# Patient Record
Sex: Male | Born: 2000 | Race: White | Hispanic: No | Marital: Single | Smoking: Never smoker
Health system: Southern US, Community
[De-identification: ages and names within clinical notes are randomized; demographics above are authoritative.]

## PROBLEM LIST (undated history)

## (undated) DIAGNOSIS — J45909 Unspecified asthma, uncomplicated: Secondary | ICD-10-CM

## (undated) HISTORY — PX: TYMPANOSTOMY TUBE PLACEMENT: SHX32

## (undated) HISTORY — PX: FRACTURE SURGERY: SHX138

---
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
## 2024-05-10 ENCOUNTER — Encounter (HOSPITAL_COMMUNITY): Payer: Self-pay | Admitting: Emergency Medicine

## 2024-05-10 ENCOUNTER — Other Ambulatory Visit: Payer: Self-pay

## 2024-05-10 ENCOUNTER — Emergency Department (HOSPITAL_COMMUNITY)

## 2024-05-10 ENCOUNTER — Encounter (HOSPITAL_COMMUNITY): Payer: Self-pay

## 2024-05-10 ENCOUNTER — Ambulatory Visit (HOSPITAL_COMMUNITY): Admission: EM | Admit: 2024-05-10 | Discharge: 2024-05-10 | Disposition: A | Discharge status: Home / Self Care

## 2024-05-10 ENCOUNTER — Emergency Department (HOSPITAL_COMMUNITY): Admission: EM | Admit: 2024-05-10 | Discharge: 2024-05-10 | Disposition: A | Discharge status: Home / Self Care

## 2024-05-10 DIAGNOSIS — R079 Chest pain, unspecified: Secondary | ICD-10-CM

## 2024-05-10 DIAGNOSIS — R0602 Shortness of breath: Secondary | ICD-10-CM

## 2024-05-10 DIAGNOSIS — R42 Dizziness and giddiness: Secondary | ICD-10-CM

## 2024-05-10 LAB — BASIC METABOLIC PANEL WITH GFR
Anion gap: 13 (ref 5–15)
BUN: 24 mg/dL — ABNORMAL HIGH (ref 6–20)
CO2: 23 mmol/L (ref 22–32)
Calcium: 9.5 mg/dL (ref 8.9–10.3)
Chloride: 102 mmol/L (ref 98–111)
Creatinine, Ser: 1.19 mg/dL (ref 0.61–1.24)
GFR, Estimated: >60 mL/min (ref >60)
Glucose, Bld: 85 mg/dL (ref 70–99)
Potassium: 4.2 mmol/L (ref 3.5–5.1)
Sodium: 138 mmol/L (ref 135–145)

## 2024-05-10 LAB — CBC
HCT: 44.2 % (ref 39.0–52.0)
Hemoglobin: 14.5 g/dL (ref 13.0–17.0)
MCH: 28.8 pg (ref 26.0–34.0)
MCHC: 32.8 g/dL (ref 30.0–36.0)
MCV: 87.7 fL (ref 80.0–100.0)
Platelets: 276 K/uL (ref 150–400)
RBC: 5.04 MIL/uL (ref 4.22–5.81)
RDW: 14.5 % (ref 11.5–15.5)
WBC: 8.1 K/uL (ref 4.0–10.5)
nRBC: 0 % (ref 0.0–0.2)

## 2024-05-10 LAB — TROPONIN I (HIGH SENSITIVITY): Troponin I (High Sensitivity): 10 ng/L (ref <18)

## 2024-05-10 NOTE — ED Provider Notes (Signed)
 Accepted handoff at shift change from [facility]. Please see prior provider note for more detail.   Briefly: Patient is 23 y.o. presenting for chest pain.  DDX: concern for DDx includes PE, ACS, pneumonia, pneumothorax, anemia, other.  Plan: Reassess and follow-up on BMP   Physical Exam  BP (!) 143/75   Pulse 85   Temp 98.1 F (36.7 C)   Resp 18   Ht 5' 6 (1.676 m)   Wt 73.5 kg   SpO2 99%   BMI 26.15 kg/m   Physical Exam  Procedures  Procedures  ED Course / MDM   Clinical Course as of 05/10/24 1553  Fri May 10, 2024  1503 Here for chest pain. IF bmp is ok, can dc with PCP f/u. [JR]    Clinical Course User Index [JR] Lang Norleen POUR, PA-C   Medical Decision Making Amount and/or Complexity of Data Reviewed Labs: ordered. Radiology: ordered.   On reassessment, chest pain improved.  Workup unremarkable.  Advised him to follow-up with his PCP.  Discussed return precautions.  Discharged good condition.        Lang Norleen POUR, PA-C 05/10/24 1608    Ruthe Cornet, DO 05/10/24 1826

## 2024-05-10 NOTE — Discharge Instructions (Addendum)
 Evaluation today was reassuring.  If your symptoms worsen anyway please return to the ED for further evaluation.  Otherwise recommend follow-up with your PCP.

## 2024-05-10 NOTE — ED Provider Notes (Signed)
 [place] EMERGENCY DEPARTMENT AT [facility] Provider Note   CSN: [id number] Arrival date & time: 05/10/24  1200     Patient presents with: Shortness of Breath and Chest Pain   Donald Good is a 23 y.o. male.   Patient complains of chest pain that started this a.m.  Patient reports he has pain in his mid chest.  Patient reports symptoms began this a.m.  Patient reports he has not had any cough or congestion.  Patient denies any fever or chills he has not had any injury.  Patient denies any exposure to anyone with illness.  Patient denies any history of heart disease he does not have any high blood pressure no diabetes.  Patient reports he does not smoke.  Patient reports he does do strenuous activity.  He does do heavy lifting.  Patient was evaluated at urgent care and sent here for evaluation.  The history is provided by the patient. No language interpreter was used.  Shortness of Breath Associated symptoms: chest pain   Chest Pain Associated symptoms: shortness of breath        Prior to Admission medications   Not on File    Allergies: Augmentin [amoxicillin-pot clavulanate]    Review of Systems  Respiratory:  Positive for shortness of breath.   Cardiovascular:  Positive for chest pain.  All other systems reviewed and are negative.   Updated Vital Signs BP (!) 143/75   Pulse 85   Temp 98.1 F (36.7 C)   Resp 18   Ht 5' 6 (1.676 m)   Wt 73.5 kg   SpO2 99%   BMI 26.15 kg/m   Physical Exam Vitals and nursing note reviewed.  Constitutional:      Appearance: He is well-developed.  HENT:     Head: Normocephalic.  Cardiovascular:     Rate and Rhythm: Normal rate.  Pulmonary:     Effort: Pulmonary effort is normal.     Breath sounds: No decreased breath sounds.  Abdominal:     General: There is no distension.  Musculoskeletal:        General: Normal range of motion.     Cervical back: Normal range of motion.  Skin:    General: Skin is warm.   Neurological:     General: No focal deficit present.     Mental Status: He is alert and oriented to person, place, and time.  Psychiatric:        Mood and Affect: Mood normal.     (all labs ordered are listed, but only abnormal results are displayed) Labs Reviewed  BASIC METABOLIC PANEL WITH GFR  CBC  TROPONIN I (HIGH SENSITIVITY)  TROPONIN I (HIGH SENSITIVITY)    EKG: EKG Interpretation Date/Time:  Friday May 10 2024 12:10:51 EDT Ventricular Rate:  70 PR Interval:  130 QRS Duration:  96 QT Interval:  372 QTC Calculation: 401 R Axis:   87  Text Interpretation: Normal sinus rhythm Incomplete right bundle branch block Borderline ECG No previous ECGs available Confirmed by Donald Good [phone]) on 05/10/2024 1:23:28 PM  Radiology: DG Chest 2 View Result Date: 05/10/2024 CLINICAL DATA:  Chest pain EXAM: CHEST - 2 VIEW COMPARISON:  Chest radiograph 09/11/2005 FINDINGS: The heart size and mediastinal contours are within normal limits. Both lungs are clear. The visualized skeletal structures are unremarkable. IMPRESSION: No active cardiopulmonary disease. Electronically Signed   By: Donald Good M.D.   On: 05/10/2024 13:49     Procedures   Medications  Ordered in the ED - No data to display  Clinical Course as of 05/10/24 1505  Fri May 10, 2024  1503 Here for chest pain. IF bmp is ok, can dc with PCP f/u. [JR]    Clinical Course User Index [JR] Donald Norleen POUR, PA-C                                 Medical Decision Making Patient complains of pain in the middle of his chest since this a.m. he has not had any shortness of breath.  Patient has not had any recent travel he is not on any hormone therapy.  Amount and/or Complexity of Data Reviewed External Data Reviewed: notes.    Details: Urged care note reviewed Labs: ordered.    Details: Labs ordered reviewed and interpreted Radiology: ordered and independent interpretation performed. Decision-making details documented  in ED Course.    Details: Chest x-ray no acute cardiopulmonary disease ECG/medicine tests: ordered and independent interpretation performed. Decision-making details documented in ED Course.    Details: EKG normal sinus rhythm normal EKG.        Final diagnoses:  Nonspecific chest pain    ED Discharge Orders     None          Donald Good Donald Good 05/10/24 1515    Donald Prentice SAUNDERS, MD 05/10/24 [phone]

## 2024-05-10 NOTE — ED Triage Notes (Signed)
 Pt reports yesterday around 6p started having central chest pain, sore throat with swall;owing. Chest pain worse with deep breathing and picking up heavy boxes. Hasn't taken anything for symptoms.

## 2024-05-10 NOTE — ED Triage Notes (Signed)
 Pt to ED from [facility] c/o [facility] worse with exertion that started yesterday around 6pm, reports sharp chest pain with deep breathing.

## 2024-05-10 NOTE — ED Provider Notes (Signed)
 Patient to urgent care today for evaluation of chest pain that started last night around 6 PM.  He states that he is having some shortness of breath and has chest pain more when he is picking up a heavy box.  He also reports he had some sore throat with swallowing.  He has had some nausea but no vomiting.  He does note some lightheadedness at times.  Recommended further evaluation in the emergency room for stat labs and imaging.  Patient is agreeable with same and will transport via POV.   Billy Asberry FALCON, PA-C 05/10/24 1156

## 2024-05-10 NOTE — ED Notes (Signed)
 Pt was d/c at 1623 by Olam, RN & was on the board after departure but v/s were done & pain rated 1 before leaving.

## 2024-06-25 ENCOUNTER — Ambulatory Visit

## 2024-06-25 VITALS — BP 126/82 | HR 70 | Temp 98.1°F | Resp 16 | Ht 66.0 in | Wt 160.5 lb

## 2024-06-25 DIAGNOSIS — M940 Chondrocostal junction syndrome [Tietze]: Secondary | ICD-10-CM | POA: Insufficient documentation

## 2024-06-25 DIAGNOSIS — J028 Acute pharyngitis due to other specified organisms: Secondary | ICD-10-CM | POA: Diagnosis not present

## 2024-06-25 DIAGNOSIS — B9789 Other viral agents as the cause of diseases classified elsewhere: Secondary | ICD-10-CM | POA: Diagnosis not present

## 2024-06-25 DIAGNOSIS — M545 Low back pain, unspecified: Secondary | ICD-10-CM | POA: Diagnosis not present

## 2024-06-25 DIAGNOSIS — R059 Cough, unspecified: Secondary | ICD-10-CM | POA: Insufficient documentation

## 2024-06-25 DIAGNOSIS — G8929 Other chronic pain: Secondary | ICD-10-CM | POA: Insufficient documentation

## 2024-06-25 LAB — POC COVID19/FLU A&B COMBO
Covid Antigen, POC: NEGATIVE
Influenza A Antigen, POC: NEGATIVE
Influenza B Antigen, POC: NEGATIVE

## 2024-06-25 LAB — POCT RAPID STREP A (OFFICE): Rapid Strep A Screen: NEGATIVE

## 2024-06-25 NOTE — Assessment & Plan Note (Addendum)
 Patient with 4 days of cough, congestion, N/V/D, and sore throat. Recent sick contact with COVID. POC testing today negative for COVID, flu, and strep. Most likely viral illness which is already improving. - Recommend rest and supportive care including plenty of fluids, NSAIDs PRN, Mucinex PRN

## 2024-06-25 NOTE — Assessment & Plan Note (Signed)
 Patient with episode of chest pain 1 month ago that has now resolved. Went to ED at that time and cardiac workup was normal. Low suspicion of cardiac etiology. Given patient was lifting heavy boxes and working out, I suspect he had costochondritis vs. muscle strain. No further treatment needed at this time given pain has resolved.

## 2024-06-25 NOTE — Patient Instructions (Addendum)
 I recommend trying Mucinex (guaifenesin) for cough and congestion.   I recommend warm tea w/honey and  ibuprofen for sore throat.
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
## 2024-08-01 NOTE — Progress Notes (Deleted)
      Established patient visit   Patient: Donald Good   DOB: 02-15-2001   23 y.o. Male  MRN: [id number] Visit Date: 08/02/2024  Today's healthcare provider: Isaiah DELENA Pepper, MD   No chief complaint on file.  Subjective    HPI            Medications: No outpatient medications prior to visit.   No facility-administered medications prior to visit.    Review of Systems as noted in HPI.  {Insert previous labs (optional):23779} {See past labs  Heme  Chem  Endocrine  Serology  Results Review (optional):1}   Objective    There were no vitals taken for this visit. {Insert last BP/Wt (optional):23777}{See vitals history (optional):1}  Physical Exam   No results found for any visits on 08/02/24.  Assessment & Plan     Problem List Items Addressed This Visit   None   Assessment and Plan               No follow-ups on file.       Isaiah DELENA Pepper, MD  [facility] [phone] (phone) [phone] (fax)

## 2024-08-02 ENCOUNTER — Ambulatory Visit

## 2024-08-12 ENCOUNTER — Ambulatory Visit

## 2024-08-12 VITALS — BP 131/73 | HR 72 | Ht 66.5 in | Wt 169.2 lb

## 2024-08-12 DIAGNOSIS — G4709 Other insomnia: Secondary | ICD-10-CM

## 2024-08-12 DIAGNOSIS — Z23 Encounter for immunization: Secondary | ICD-10-CM

## 2024-08-12 MED ORDER — PRAZOSIN HCL 1 MG PO CAPS: 1.0000 mg | ORAL_CAPSULE | Freq: Every day | ORAL | 3 refills | Status: AC

## 2024-08-12 NOTE — Progress Notes (Signed)
 Established patient visit   Patient: Donald Good   DOB: 30-Mar-2001   23 y.o. 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 He reports that sometimes he may fall asleep and his brother doesn't think he is breathing so he will check on him.    Insomnia    Melatonin helps with getting to sleep but still struggling staying asleep. Reports waking up 2-3 times a night   Subjective    HPI  Discussed the use of AI scribe software for clinical note transcription with the patient, who gave verbal consent to proceed.  History of Present Illness Donald Good is a 23 year old male who presents with sleep disturbances and nightmares.  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 This irregular schedule made it difficult for him to maintain a consistent sleep pattern. Despite moving to a more stable day schedule in [place] , he continues to have trouble staying asleep, waking up multiple times throughout the night.  He does not snore, but his brother, with whom he shares an apartment, has occasionally expressed concern about his breathing during sleep. He has not been told that he gasps for air.  He has tried using melatonin, which helps him fall asleep, especially after a poor night's sleep, but it does not prevent him from waking up during the night. He identifies nightmares as a significant factor in his sleep disturbances, which began after a traumatic event involving the loss of a close friend in [place]. He experiences both 'bad dreams' and 'nice dreams' that disrupt his sleep.  His current sleep schedule involves waking up at 4 AM  and going to bed around 8 or 9 PM. He works as a Art therapist, a physically demanding job that leaves him exhausted by the end of the day, allowing him to fall asleep easily but not stay asleep.  His mental health is generally good, though he admits to being more concerned with others' well-being than his own. He does not have thoughts of self-harm but acknowledges that lack of sleep may contribute to irritability over minor issues.  Medications: No outpatient medications prior to visit.   No facility-administered medications prior to visit.    Review of Systems as noted in HPI.      Objective    BP 131/73 (BP Location: Left Arm, Patient Position: Sitting, Cuff Size: Normal)   Pulse 72   Ht 5' 6.5 (1.689 m)   Wt 169 lb 3.2 oz (76.7 kg)   SpO2 98%   BMI 26.90 kg/m    Physical Exam Constitutional:      Appearance: Normal appearance.  HENT:     Head: Normocephalic and atraumatic.     Mouth/Throat:     Mouth: Mucous membranes are moist.  Eyes:     Pupils: Pupils are equal, round, and reactive to light.  Pulmonary:     Effort: Pulmonary effort is normal.  Skin:    General: Skin is warm.  Neurological:     General: No focal deficit present.     Mental Status: He is alert.      No  results found for any visits on 08/12/24.  Assessment & Plan     Problem List Items Addressed This Visit   None Visit Diagnoses       Other insomnia    -  Primary   Relevant Medications   prazosin (MINIPRESS) 1 MG capsule     Immunization due       Relevant Orders   Flu vaccine trivalent PF, 6mos and older(Flulaval,Afluria,Fluarix,Fluzone) (Completed)      Assessment & Plan Insomnia with nightmares Chronic, uncontrolled. Patient with >1 year of insomnia with difficulty staying asleep, likely exacerbated by nightmares related to his time in the [employer]. Lower risk for sleep apnea given no evidence of snoring and STOP-BANG score of 2. - Prescribe prazosin 1 mg at bedtime,  increase to 2 mg after a few days if tolerated. - Discussed side effects - Reassess in one month to evaluate prazosin's effectiveness on sleep and nightmares. - Consider sleep study if sleep disturbances persist   Return in about 4 weeks (around 09/09/2024) for Follow Up.       Isaiah DELENA Pepper, MD  [facility] [phone] (phone) [phone] (fax)

## 2024-09-09 ENCOUNTER — Ambulatory Visit

## 2024-09-09 NOTE — Progress Notes (Deleted)
      Established patient visit   Patient: Donald Good   DOB: 11-Jun-2001   23 y.o. Male  MRN: [id number] Visit Date: 09/09/2024  Today's healthcare provider: Isaiah DELENA Pepper, MD   No chief complaint on file.  Subjective    HPI  Discussed the use of AI scribe software for clinical note transcription with the patient, who gave verbal consent to proceed.  History of Present Illness      Medications: Outpatient Medications Prior to Visit  Medication Sig   prazosin (MINIPRESS) 1 MG capsule Take 1 capsule (1 mg total) by mouth at bedtime. If tolerating the medication, can increase to 2 capsules (2mg ) nightly.   No facility-administered medications prior to visit.    Review of Systems as noted in HPI.  {Insert previous labs (optional):23779} {See past labs  Heme  Chem  Endocrine  Serology  Results Review (optional):1}   Objective    There were no vitals taken for this visit. {Insert last BP/Wt (optional):23777}{See vitals history (optional):1}  Physical Exam   No results found for any visits on 09/09/24.  Assessment & Plan     Problem List Items Addressed This Visit   None   Assessment and Plan Assessment & Plan       No follow-ups on file.       Isaiah DELENA Pepper, MD  [facility] [phone] (phone) [phone] (fax)

## 2024-11-10 ENCOUNTER — Ambulatory Visit
Admission: EM | Admit: 2024-11-10 | Discharge: 2024-11-10 | Disposition: A | Discharge status: Home / Self Care | Attending: Nurse Practitioner | Admitting: Nurse Practitioner

## 2024-11-10 ENCOUNTER — Encounter: Payer: Self-pay | Admitting: Emergency Medicine

## 2024-11-10 DIAGNOSIS — J069 Acute upper respiratory infection, unspecified: Secondary | ICD-10-CM

## 2024-11-10 LAB — POC COVID19/FLU A&B COMBO
Covid Antigen, POC: NEGATIVE
Influenza A Antigen, POC: NEGATIVE
Influenza B Antigen, POC: NEGATIVE

## 2024-11-10 MED ORDER — BENZONATATE 100 MG PO CAPS: 100.0000 mg | ORAL_CAPSULE | Freq: Three times a day (TID) | ORAL | 0 refills | Status: AC | PRN

## 2024-11-10 NOTE — ED Provider Notes (Signed)
 " [facility]    CSN: [id number] Arrival date & time: 11/10/24  0906      History   Chief Complaint Chief Complaint  Patient presents with   Cough    HPI Donald Good is a 24 y.o. male.   Patient presents today with 1 day history of chills, body pain, dry cough, slight shortness of breath and chest pain with coughing, runny and stuffy nose, postnasal drainage, sore throat, headache, coughing up mucus, and fatigue.  He denies known fevers, chest pain or shortness of breath at rest, ear pain, abdominal pain, nausea, vomiting, and diarrhea.  Reports multiple sick contacts at work recently.  Has taken Sudafed which helped with the coughing.    Past Medical History:  Diagnosis Date   Asthma    as infant.  No issues since leaving [facility] many yrs ago    Patient Active Problem List   Diagnosis Date Noted   Cough 06/25/2024   Chronic back pain 06/25/2024   Costochondritis 06/25/2024    Past Surgical History:  Procedure Laterality Date   CLOSED REDUCTION NASAL FRACTURE N/A 05/14/2019   Procedure: CLOSED REDUCTION NASAL FRACTURE;  Surgeon: Blair Mt, MD;  Location: [facility];  Service: ENT;  Laterality: N/A;   FRACTURE SURGERY     arm.  age 56   TYMPANOSTOMY TUBE PLACEMENT         Home Medications    Prior to Admission medications  Medication Sig Start Date End Date Taking? Authorizing Provider  benzonatate  (TESSALON ) 100 MG capsule Take 1 capsule (100 mg total) by mouth 3 (three) times daily as needed for cough. Do not take with alcohol or while operating or driving heavy machinery 8/88/73  Yes Chandra Raisin A, NP  prazosin  (MINIPRESS ) 1 MG capsule Take 1 capsule (1 mg total) by mouth at bedtime. If tolerating the medication, can increase to 2 capsules (2mg ) nightly. 08/12/24   Franchot Isaiah LABOR, MD    Family History History reviewed. No pertinent family history.  Social History Social History[1]   Allergies   Augmentin [amoxicillin-pot  clavulanate]   Review of Systems Review of Systems Per HPI  Physical Exam Triage Vital Signs ED Triage Vitals  Encounter Vitals Group     BP 11/10/24 0936 139/89     Girls Systolic BP Percentile --      Girls Diastolic BP Percentile --      Boys Systolic BP Percentile --      Boys Diastolic BP Percentile --      Pulse Rate 11/10/24 0936 90     Resp 11/10/24 0936 15     Temp 11/10/24 0936 98.3 F (36.8 C)     Temp Source 11/10/24 0936 Oral     SpO2 11/10/24 0936 99 %     Weight 11/10/24 0934 185 lb (83.9 kg)     Height 11/10/24 0934 5' 6 (1.676 m)     Head Circumference --      Peak Flow --      Pain Score 11/10/24 0933 2     Pain Loc --      Pain Education --      Exclude from Growth Chart --    No data found.  Updated Vital Signs BP 139/89 (BP Location: Left Arm)   Pulse 90   Temp 98.3 F (36.8 C) (Oral)   Resp 15   Ht 5' 6 (1.676 m)   Wt 185 lb (83.9 kg)   SpO2 99%  BMI 29.86 kg/m   Visual Acuity Right Eye Distance:   Left Eye Distance:   Bilateral Distance:    Right Eye Near:   Left Eye Near:    Bilateral Near:     Physical Exam Vitals and nursing note reviewed.  Constitutional:      General: He is not in acute distress.    Appearance: Normal appearance. He is not ill-appearing or toxic-appearing.  HENT:     Head: Normocephalic and atraumatic.     Right Ear: Tympanic membrane, ear canal and external ear normal.     Left Ear: Tympanic membrane, ear canal and external ear normal.     Nose: Congestion present. No rhinorrhea.     Mouth/Throat:     Mouth: Mucous membranes are moist.     Pharynx: Oropharynx is clear. No oropharyngeal exudate or posterior oropharyngeal erythema.  Eyes:     General: No scleral icterus.    Extraocular Movements: Extraocular movements intact.  Cardiovascular:     Rate and Rhythm: Normal rate and regular rhythm.  Pulmonary:     Effort: Pulmonary effort is normal. No respiratory distress.     Breath sounds: Normal  breath sounds. No wheezing, rhonchi or rales.  Musculoskeletal:     Cervical back: Normal range of motion and neck supple.  Lymphadenopathy:     Cervical: No cervical adenopathy.  Skin:    General: Skin is warm and dry.     Coloration: Skin is not jaundiced or pale.     Findings: No erythema or rash.  Neurological:     Mental Status: He is alert and oriented to person, place, and time.  Psychiatric:        Behavior: Behavior is cooperative.      UC Treatments / Results  Labs (all labs ordered are listed, but only abnormal results are displayed) Labs Reviewed  POC COVID19/FLU A&B COMBO - Normal    EKG   Radiology No results found.  Procedures Procedures (including critical care time)  Medications Ordered in UC Medications - No data to display  Initial Impression / Assessment and Plan / UC Course  I have reviewed the triage vital signs and the nursing notes.  Pertinent labs & imaging results that were available during my care of the patient were reviewed by me and considered in my medical decision making (see chart for details).   Patient is a pleasant, well-appearing 24 year old male presenting today for cough and congestion.  Vital signs are stable in triage and COVID-19, influenza testing is negative.  Discussed most likely viral upper respiratory infection.  Supportive care discussed including Mucinex, hydration, Tylenol /Motrin, cough suppressant medication, nasal saline.  ER and return precautions discussed.  Work excuse provided.  The patient was given the opportunity to ask questions.  All questions answered to their satisfaction.  The patient is in agreement to this plan.   Final Clinical Impressions(s) / UC Diagnoses   Final diagnoses:  Viral URI with cough     Discharge Instructions      You have a viral upper respiratory infection.  COVID-19 and influenza testing is negative today.  Symptoms should improve over the next week to 10 days.  If you develop  chest pain or shortness of breath, go to the emergency room.  Some things that can make you feel better are: - Increased rest - Increasing fluid with water/sugar free electrolytes - Acetaminophen  and ibuprofen as needed for fever/pain - Salt water gargling, chloraseptic spray and throat lozenges -  OTC guaifenesin (Mucinex) 600 mg twice daily - Saline sinus flushes or a neti pot - Humidifying the air -Tessalon  Perles every 8 hours as needed for dry cough      ED Prescriptions     Medication Sig Dispense Auth. Provider   benzonatate  (TESSALON ) 100 MG capsule Take 1 capsule (100 mg total) by mouth 3 (three) times daily as needed for cough. Do not take with alcohol or while operating or driving heavy machinery 21 capsule Chandra Harlene LABOR, NP      PDMP not reviewed this encounter.     [1]  Social History Tobacco Use   Smoking status: Never    Passive exposure: Yes   Smokeless tobacco: Never  Vaping Use   Vaping status: Never Used  Substance Use Topics   Alcohol use: Yes   Drug use: Never     Chandra Harlene LABOR, NP 11/10/24 1009  "

## 2024-11-10 NOTE — Discharge Instructions (Signed)
 You have a viral upper respiratory infection.  COVID-19 and influenza testing is negative today.  Symptoms should improve over the next week to 10 days.  If you develop chest pain or shortness of breath, go to the emergency room.  Some things that can make you feel better are: - Increased rest - Increasing fluid with water /sugar free electrolytes - Acetaminophen  and ibuprofen  as needed for fever/pain - Salt water  gargling, chloraseptic spray and throat lozenges - OTC guaifenesin (Mucinex) 600 mg twice daily - Saline sinus flushes or a neti pot - Humidifying the air -Tessalon  Perles every 8 hours as needed for dry cough

## 2024-11-10 NOTE — ED Triage Notes (Addendum)
 Patient c/o cough and chest congestion that started yesterday.  Patient reports chills.  Patient reports vomiting once this morning.  Patient has been taking OTC Sudafed.  Patient unsure of fevers. Patient is in [place]  [employer] and is requesting COVID and Flu test for his job.
